# Patient Record
Sex: Female | Born: 1995 | Race: White | Hispanic: No | Marital: Single | Smoking: Never smoker
Health system: Southern US, Community
[De-identification: ages and names within clinical notes are randomized; demographics above are authoritative.]

---
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
## 2017-05-03 ENCOUNTER — Encounter: Payer: Self-pay | Admitting: Obstetrics & Gynecology

## 2017-05-03 ENCOUNTER — Ambulatory Visit (INDEPENDENT_AMBULATORY_CARE_PROVIDER_SITE_OTHER): Payer: BC Managed Care – PPO | Admitting: Obstetrics & Gynecology

## 2017-05-03 VITALS — BP 102/68 | Ht 67.0 in | Wt 106.0 lb

## 2017-05-03 DIAGNOSIS — N946 Dysmenorrhea, unspecified: Secondary | ICD-10-CM | POA: Diagnosis not present

## 2017-05-03 MED ORDER — NORETHIN ACE-ETH ESTRAD-FE 1-20 MG-MCG(24) PO TABS: 1.0000 | ORAL_TABLET | Freq: Every day | ORAL | 4 refills | Status: DC

## 2017-05-03 NOTE — Progress Notes (Signed)
    Lynn RichSavanah L Baker 01/31/1996 [id number]        21 y.o.  G0 single.  No coitarche.  [employer] [place] [place]  RP:  Dysmenorrhea worsening x 2 yrs  HPI:  Menses reg every month x 5 days.  Severe pelvic cramping and pain x 2-3 first days of period.  Pain is flooring her.  Occasional N/V.  Advil helpful, but not enough.  Flow normal.  No Pain outside periods.  No PMS.  No coitarche.  Fit, plays water-polo.  Past medical history,surgical history, problem list, medications, allergies, family history and social history were all reviewed and documented in the EPIC chart.  Directed ROS with pertinent positives and negatives documented in the history of present illness/assessment and plan.  Exam:  Vitals:   05/03/17 1524  BP: 102/68  Weight: 106 lb (48.1 kg)  Height: 5\' 7"  (1.702 m)   General appearance:  Normal  Heart:  RCR, no murmur  Lungs clear  Abdo:  Soft, NT, no mass.  Assessment/Plan:  21 y.o. G0  1. Severe dysmenorrhea Dysmenorrhea discussed, information given.  Reassured that pathology very unlikely.  If early Sx of Endometriosis, recommend attempting to control hormonally.  Different treatment options reviewed, including DepoProvera, BCPs, Nuvaring.  Nexplanon and Progesterone IUD briefly discussed but not recommended.  Decision to start on Lomedia Fe 1/20 (24) continuous use.  Usage/risks/benefits reviewed in details.  Prescription sent.  Will call back to be evaluated with Pelvic US if no improvement after about 3 packs.  Counseling on above issue >50% x 30 minutes.  Genia DelMarie-Lyne Conal Shetley MD, 3:40 PM 05/03/2017

## 2017-05-03 NOTE — Patient Instructions (Signed)
1. Severe dysmenorrhea Dysmenorrhea discussed, information given.  Reassured that pathology very unlikely.  If early Sx of Endometriosis, recommend attempting to control hormonally.  Different treatment options reviewed, including DepoProvera, BCPs, Nuvaring.  Nexplanon and Progesterone IUD briefly discussed but not recommended.  Decision to start on Lomedia Fe 1/20 (24) continuous use.  Usage/risks/benefits reviewed in details.  Prescription sent.  Will call back to be evaluated with Pelvic US if no improvement after about 3 packs.  Lynn Baker, it was a pleasure to meet you today!  Please call me if you have any problem until your next Annual/Gyn visit.  Dysmenorrhea Menstrual cramps (dysmenorrhea) are caused by the muscles of the uterus tightening (contracting) during a menstrual period. For some women, this discomfort is merely bothersome. For others, dysmenorrhea can be severe enough to interfere with everyday activities for a few days each month. Primary dysmenorrhea is menstrual cramps that last a couple of days when you start having menstrual periods or soon after. This often begins after a teenager starts having her period. As a woman gets older or has a baby, the cramps will usually lessen or disappear. Secondary dysmenorrhea begins later in life, lasts longer, and the pain may be stronger than primary dysmenorrhea. The pain may start before the period and last a few days after the period. What are the causes? Dysmenorrhea is usually caused by an underlying problem, such as:  The tissue lining the uterus grows outside of the uterus in other areas of the body (endometriosis).  The endometrial tissue, which normally lines the uterus, is found in or grows into the muscular walls of the uterus (adenomyosis).  The pelvic blood vessels are engorged with blood just before the menstrual period (pelvic congestive syndrome).  Overgrowth of cells (polyps) in the lining of the uterus or cervix.  Falling  down of the uterus (prolapse) because of loose or stretched ligaments.  Depression.  Bladder problems, infection, or inflammation.  Problems with the intestine, a tumor, or irritable bowel syndrome.  Cancer of the female organs or bladder.  A severely tipped uterus.  A very tight opening or closed cervix.  Noncancerous tumors of the uterus (fibroids).  Pelvic inflammatory disease (PID).  Pelvic scarring (adhesions) from a previous surgery.  Ovarian cyst.  An intrauterine device (IUD) used for birth control.  What increases the risk? You may be at greater risk of dysmenorrhea if:  You are younger than age 330.  You started puberty early.  You have irregular or heavy bleeding.  You have never given birth.  You have a family history of this problem.  You are a smoker.  What are the signs or symptoms?  Cramping or throbbing pain in your lower abdomen.  Headaches.  Lower back pain.  Nausea or vomiting.  Diarrhea.  Sweating or dizziness.  Loose stools. How is this diagnosed? A diagnosis is based on your history, symptoms, physical exam, diagnostic tests, or procedures. Diagnostic tests or procedures may include:  Blood tests.  Ultrasonography.  An examination of the lining of the uterus (dilation and curettage, D&C).  An examination inside your abdomen or pelvis with a scope (laparoscopy).  X-rays.  CT scan.  MRI.  An examination inside the bladder with a scope (cystoscopy).  An examination inside the intestine or stomach with a scope (colonoscopy, gastroscopy).  How is this treated? Treatment depends on the cause of the dysmenorrhea. Treatment may include:  Pain medicine prescribed by your health care provider.  Birth control pills or an IUD  with progesterone hormone in it.  Hormone replacement therapy.  Nonsteroidal anti-inflammatory drugs (NSAIDs). These may help stop the production of prostaglandins.  Surgery to remove adhesions,  endometriosis, ovarian cyst, or fibroids.  Removal of the uterus (hysterectomy).  Progesterone shots to stop the menstrual period.  Cutting the nerves on the sacrum that go to the female organs (presacral neurectomy).  Electric current to the sacral nerves (sacral nerve stimulation).  Antidepressant medicine.  Psychiatric therapy, counseling, or group therapy.  Exercise and physical therapy.  Meditation and yoga therapy.  Acupuncture.  Follow these instructions at home:  Only take over-the-counter or prescription medicines as directed by your health care provider.  Place a heating pad or hot water bottle on your lower back or abdomen. Do not sleep with the heating pad.  Use aerobic exercises, walking, swimming, biking, and other exercises to help lessen the cramping.  Massage to the lower back or abdomen may help.  Stop smoking.  Avoid alcohol and caffeine. Contact a health care provider if:  Your pain does not get better with medicine.  You have pain with sexual intercourse.  Your pain increases and is not controlled with medicines.  You have abnormal vaginal bleeding with your period.  You develop nausea or vomiting with your period that is not controlled with medicine. Get help right away if: You pass out. This information is not intended to replace advice given to you by your health care provider. Make sure you discuss any questions you have with your health care provider. Document Released: 09/07/2005 Document Revised: 02/13/2016 Document Reviewed: 02/23/2013 Elsevier Interactive Patient Education  2017 Elsevier Inc.   Oral Contraception Use Oral contraceptive pills (OCPs) are medicines taken to prevent pregnancy. OCPs work by preventing the ovaries from releasing eggs. The hormones in OCPs also cause the cervical mucus to thicken, preventing the sperm from entering the uterus. The hormones also cause the uterine lining to become thin, not allowing a  fertilized egg to attach to the inside of the uterus. OCPs are highly effective when taken exactly as prescribed. However, OCPs do not prevent sexually transmitted diseases (STDs). Safe sex practices, such as using condoms along with an OCP, can help prevent STDs. Before taking OCPs, you may have a physical exam and Pap test. Your health care provider may also order blood tests if necessary. Your health care provider will make sure you are a good candidate for oral contraception. Discuss with your health care provider the possible side effects of the OCP you may be prescribed. When starting an OCP, it can take 2 to 3 months for the body to adjust to the changes in hormone levels in your body. How to take oral contraceptive pills Your health care provider may advise you on how to start taking the first cycle of OCPs. Otherwise, you can:  Start on day 1 of your menstrual period. You will not need any backup contraceptive protection with this start time.  Start on the first Sunday after your menstrual period or the day you get your prescription. In these cases, you will need to use backup contraceptive protection for the first week.  Start the pill at any time of your cycle. If you take the pill within 5 days of the start of your period, you are protected against pregnancy right away. In this case, you will not need a backup form of birth control. If you start at any other time of your menstrual cycle, you will need to use another form of  birth control for 7 days. If your OCP is the type called a minipill, it will protect you from pregnancy after taking it for 2 days (48 hours).  After you have started taking OCPs:  If you forget to take 1 pill, take it as soon as you remember. Take the next pill at the regular time.  If you miss 2 or more pills, call your health care provider because different pills have different instructions for missed doses. Use backup birth control until your next menstrual period  starts.  If you use a 28-day pack that contains inactive pills and you miss 1 of the last 7 pills (pills with no hormones), it will not matter. Throw away the rest of the non-hormone pills and start a new pill pack.  No matter which day you start the OCP, you will always start a new pack on that same day of the week. Have an extra pack of OCPs and a backup contraceptive method available in case you miss some pills or lose your OCP pack. Follow these instructions at home:  Do not smoke.  Always use a condom to protect against STDs. OCPs do not protect against STDs.  Use a calendar to mark your menstrual period days.  Read the information and directions that came with your OCP. Talk to your health care provider if you have questions. Contact a health care provider if:  You develop nausea and vomiting.  You have abnormal vaginal discharge or bleeding.  You develop a rash.  You miss your menstrual period.  You are losing your hair.  You need treatment for mood swings or depression.  You get dizzy when taking the OCP.  You develop acne from taking the OCP.  You become pregnant. Get help right away if:  You develop chest pain.  You develop shortness of breath.  You have an uncontrolled or severe headache.  You develop numbness or slurred speech.  You develop visual problems.  You develop pain, redness, and swelling in the legs. This information is not intended to replace advice given to you by your health care provider. Make sure you discuss any questions you have with your health care provider. Document Released: 08/27/2011 Document Revised: 02/13/2016 Document Reviewed: 02/26/2013 Elsevier Interactive Patient Education  2017 [employer].

## 2018-05-23 ENCOUNTER — Other Ambulatory Visit: Payer: Self-pay | Admitting: Obstetrics & Gynecology

## 2018-05-24 NOTE — Telephone Encounter (Signed)
annual exam on 07/22/18

## 2018-07-22 ENCOUNTER — Ambulatory Visit (INDEPENDENT_AMBULATORY_CARE_PROVIDER_SITE_OTHER): Payer: BC Managed Care – PPO | Admitting: Obstetrics & Gynecology

## 2018-07-22 ENCOUNTER — Encounter: Payer: Self-pay | Admitting: Obstetrics & Gynecology

## 2018-07-22 VITALS — BP 126/78 | Ht 67.0 in | Wt 115.0 lb

## 2018-07-22 DIAGNOSIS — N944 Primary dysmenorrhea: Secondary | ICD-10-CM | POA: Diagnosis not present

## 2018-07-22 DIAGNOSIS — Z01419 Encounter for gynecological examination (general) (routine) without abnormal findings: Secondary | ICD-10-CM | POA: Diagnosis not present

## 2018-07-22 MED ORDER — NORETHIN ACE-ETH ESTRAD-FE 1-20 MG-MCG(24) PO TABS: ORAL_TABLET | ORAL | 4 refills | Status: DC

## 2018-07-22 NOTE — Patient Instructions (Signed)
1. Well female exam with routine gynecological exam Normal general exam.  Pelvic exam deferred because patient is still a virgin and has no gynecologic complaints.  Decision to proceed with first Pap test next year.  Body mass index stable at 18.01.  Patient informed that she is at the low and of normal and it is important for her to maintain her weight.  Fit and healthy nutrition.  2. Primary dysmenorrhea Primary severe dysmenorrhea well-controlled on continuous birth control pills with Lomedia FE 1/20 24.  No contraindication to continue.  Same birth control pill represcribed.  Other orders - Norethindrone Acetate-Ethinyl Estrad-FE (BLISOVI 24 FE) 1-20 MG-MCG(24) tablet; TAKE 1 TABLET BY MOUTH DAILY. CONTINUOUS USE FOR SEVERE DYSMENORRHEA  Saliyah, it was a pleasure seeing you today!

## 2018-07-22 NOTE — Progress Notes (Addendum)
Lynn Baker 1996/09/03 [id number]   History:    22 y.o. G0 single.  Graduating from [employer] in [place] December 2019.  RP:  Established patient presenting for annual gyn exam   HPI: Virgin.  On Lomedia Fe 1/20 24 continuous use for severe Dysmenorrhea.  Doing very well on it with no breakthrough bleeding and no pelvic pain.  Urine and bowel movements normal.  Breasts normal.  Body mass index 18.01.  Stable weight.  Past medical history,surgical history, family history and social history were all reviewed and documented in the EPIC chart.  Gynecologic History Patient's last menstrual period was 05/05/2017. Contraception: OCP (estrogen/progesterone) Last Pap: Never Last mammogram: Never Bone Density: Never Colonoscopy: Never  Obstetric History OB History  Gravida Para Term Preterm AB Living  0 0 0 0 0 0  SAB TAB Ectopic Multiple Live Births  0 0 0 0 0     ROS: A ROS was performed and pertinent positives and negatives are included in the history.  GENERAL: No fevers or chills. HEENT: No change in vision, no earache, sore throat or sinus congestion. NECK: No pain or stiffness. CARDIOVASCULAR: No chest pain or pressure. No palpitations. PULMONARY: No shortness of breath, cough or wheeze. GASTROINTESTINAL: No abdominal pain, nausea, vomiting or diarrhea, melena or bright red blood per rectum. GENITOURINARY: No urinary frequency, urgency, hesitancy or dysuria. MUSCULOSKELETAL: No joint or muscle pain, no back pain, no recent trauma. DERMATOLOGIC: No rash, no itching, no lesions. ENDOCRINE: No polyuria, polydipsia, no heat or cold intolerance. No recent change in weight. HEMATOLOGICAL: No anemia or easy bruising or bleeding. NEUROLOGIC: No headache, seizures, numbness, tingling or weakness. PSYCHIATRIC: No depression, no loss of interest in normal activity or change in sleep pattern.     Exam:   BP 126/78 (BP Location: Right Arm, Patient Position: Sitting, Cuff Size:  Normal)   Ht 5\' 7"  (1.702 m)   Wt 115 lb (52.2 kg)   LMP 05/05/2017 Comment: take bc cont  BMI 18.01 kg/m   Body mass index is 18.01 kg/m.  General appearance : Well developed well nourished female. No acute distress HEENT: Eyes: no retinal hemorrhage or exudates,  Neck supple, trachea midline, no carotid bruits, no thyroidmegaly Lungs: Clear to auscultation, no rhonchi or wheezes, or rib retractions  Heart: Regular rate and rhythm, no murmurs or gallops Breast:Examined in sitting and supine position were symmetrical in appearance, no palpable masses or tenderness,  no skin retraction, no nipple inversion, no nipple discharge, no skin discoloration, no axillary or supraclavicular lymphadenopathy Abdomen: no palpable masses or tenderness, no rebound or guarding Extremities: no edema or skin discoloration or tenderness  Pelvic: Deferred, virgin   Assessment/Plan:  22 y.o. female for annual exam   1. Well female exam with routine gynecological exam Normal general exam.  Pelvic exam deferred because patient is still a virgin and has no gynecologic complaints.  Decision to proceed with first Pap test next year.  Body mass index stable at 18.01.  Patient informed that she is at the low and of normal and it is important for her to maintain her weight.  Fit and healthy nutrition.  2. Primary dysmenorrhea Primary severe dysmenorrhea well-controlled on continuous birth control pills with Lomedia FE 1/20 24.  No contraindication to continue.  Same birth control pill represcribed.  Other orders - Norethindrone Acetate-Ethinyl Estrad-FE (BLISOVI 24 FE) 1-20 MG-MCG(24) tablet; TAKE 1 TABLET BY MOUTH DAILY. CONTINUOUS USE FOR SEVERE DYSMENORRHEA  Genia Del  MD, 2:03 PM 07/22/2018

## 2018-12-09 ENCOUNTER — Other Ambulatory Visit: Payer: Self-pay

## 2018-12-09 ENCOUNTER — Encounter: Payer: Self-pay | Admitting: Obstetrics & Gynecology

## 2018-12-09 ENCOUNTER — Ambulatory Visit: Payer: BC Managed Care – PPO | Admitting: Obstetrics & Gynecology

## 2018-12-09 VITALS — BP 112/70

## 2018-12-09 DIAGNOSIS — B3731 Acute candidiasis of vulva and vagina: Secondary | ICD-10-CM

## 2018-12-09 DIAGNOSIS — R300 Dysuria: Secondary | ICD-10-CM | POA: Diagnosis not present

## 2018-12-09 DIAGNOSIS — B373 Candidiasis of vulva and vagina: Secondary | ICD-10-CM

## 2018-12-09 DIAGNOSIS — N898 Other specified noninflammatory disorders of vagina: Secondary | ICD-10-CM | POA: Diagnosis not present

## 2018-12-09 LAB — WET PREP FOR TRICH, YEAST, CLUE

## 2018-12-09 MED ORDER — FLUCONAZOLE 150 MG PO TABS: 150.0000 mg | ORAL_TABLET | Freq: Every day | ORAL | 1 refills | Status: AC

## 2018-12-09 NOTE — Progress Notes (Signed)
    Lynn Baker 10/09/1995 [id number]        23 y.o.  G0 Single  RP: Pain with urination and pelvic discomfort/low back pain x a few days  HPI: Complains of discomfort in the pelvis and with urination associated with low back pain for a few days.  No blood in urine.  No fever.  Virgin.  Normal regular periods every month.  No breakthrough bleeding.   OB History  Gravida Para Term Preterm AB Living  0 0 0 0 0 0  SAB TAB Ectopic Multiple Live Births  0 0 0 0 0    Past medical history,surgical history, problem list, medications, allergies, family history and social history were all reviewed and documented in the EPIC chart.   Directed ROS with pertinent positives and negatives documented in the history of present illness/assessment and plan.  Exam:  Vitals:   12/09/18 0955  BP: 112/70   General appearance:  Normal  Abdomen: Normal  CVAT negative bilaterally  Gynecologic exam: Vulva with increased yeasty discharge.  Wet prep done.  U/A completely negative Wet prep:  Yeasts present   Assessment/Plan:  23 y.o. G0  1. Dysuria Urine analysis completely negative.  Patient reassured. - Urinalysis,Complete w/RFL Culture  2. Vaginal discharge Yeast vaginitis confirmed with wet prep.  3. Yeast vaginitis Yeast vaginitis confirmed by wet prep.  Diagnosis and treatment counseling done.  Decision to treat with fluconazole.  Usage reviewed and prescription sent to pharmacy. Other orders - fluconazole (DIFLUCAN) 150 MG tablet; Take 1 tablet (150 mg total) by mouth daily for 3 days.  Counseling on above issues and coordination of care more than 50% for 15 minutes.  Genia Del MD, 10:19 AM 12/09/2018

## 2018-12-10 LAB — URINALYSIS, COMPLETE W/RFL CULTURE
BILIRUBIN URINE: NEGATIVE
Bacteria, UA: NONE SEEN /HPF
GLUCOSE, UA: NEGATIVE
HGB URINE DIPSTICK: NEGATIVE
HYALINE CAST: NONE SEEN /LPF
KETONES UR: NEGATIVE
Leukocyte Esterase: NEGATIVE
Nitrites, Initial: NEGATIVE
PH: 5.5 (ref 5.0–8.0)
Protein, ur: NEGATIVE
RBC / HPF: NONE SEEN /HPF (ref 0–2)
Specific Gravity, Urine: 1.02 (ref 1.001–1.03)
WBC UA: NONE SEEN /HPF (ref 0–5)

## 2018-12-10 LAB — NO CULTURE INDICATED

## 2018-12-11 ENCOUNTER — Encounter: Payer: Self-pay | Admitting: Obstetrics & Gynecology

## 2018-12-11 NOTE — Patient Instructions (Signed)
1. Dysuria Urine analysis completely negative.  Patient reassured. - Urinalysis,Complete w/RFL Culture  2. Vaginal discharge Yeast vaginitis confirmed with wet prep.  3. Yeast vaginitis Yeast vaginitis confirmed by wet prep.  Diagnosis and treatment counseling done.  Decision to treat with fluconazole.  Usage reviewed and prescription sent to pharmacy. Other orders - fluconazole (DIFLUCAN) 150 MG tablet; Take 1 tablet (150 mg total) by mouth daily for 3 days.  Tanyiah, it was a pleasure seeing you today!

## 2019-03-20 ENCOUNTER — Other Ambulatory Visit: Payer: Self-pay

## 2019-03-21 ENCOUNTER — Encounter: Payer: Self-pay | Admitting: Obstetrics & Gynecology

## 2019-03-21 ENCOUNTER — Ambulatory Visit: Payer: BC Managed Care – PPO | Admitting: Obstetrics & Gynecology

## 2019-03-21 VITALS — BP 102/68

## 2019-03-21 DIAGNOSIS — N644 Mastodynia: Secondary | ICD-10-CM | POA: Diagnosis not present

## 2019-03-21 NOTE — Progress Notes (Signed)
    Lynn Baker 1996/06/20 [id number]        22 y.o.  G0 Single.  Graduated 08/[employer]Looking for a job.  RP: Left breast pain x 2 weeks  HPI: Left breast pain/tenderness at external quadrant x 2 weeks.  The pain/tenderness is improving x a few days.  No lump felt.  No redness.  No nipple discharge.  No fever.  Well on continuous BCPs.  No BTB.  No pelvic pain.  No first-degree relative with breast cancer.   OB History  Gravida Para Term Preterm AB Living  0 0 0 0 0 0  SAB TAB Ectopic Multiple Live Births  0 0 0 0 0    Past medical history,surgical history, problem list, medications, allergies, family history and social history were all reviewed and documented in the EPIC chart.   Directed ROS with pertinent positives and negatives documented in the history of present illness/assessment and plan.  Exam:  Vitals:   03/21/19 1454  BP: 102/68   General appearance:  Normal  Breast exam:  Rt breast normal.  Rt axilla normal.                           Lt breast mildly tender at upper external quadrant.  No nodule/mass felt.  No erythema.  No nipple discharge.  Lt axilla normal.   Assessment/Plan:  23 y.o. G0  1. Pain of left breast Left breast pain and tenderness at the external quadrant for 2 weeks, currently improving.  Breast exam mildly tender at the left upper external quadrant but otherwise negative.  Decision to proceed with a left breast ultrasound for further investigation.  Patient reassured.  Recommended not to examine her breasts until the left breast ultrasound.  Will continue on continuous birth control pills.  Counseling on above issues and coordination of care more than 50% for 15 minutes.  Princess Bruins MD, 3:10 PM 03/21/2019

## 2019-03-21 NOTE — Patient Instructions (Signed)
1. Pain of left breast Left breast pain and tenderness at the external quadrant for 2 weeks, currently improving.  Breast exam mildly tender at the left upper external quadrant but otherwise negative.  Decision to proceed with a left breast ultrasound for further investigation.  Patient reassured.  Recommended not to examine her breasts until the left breast ultrasound.  Will continue on continuous birth control pills.  Lynn Baker, it was a pleasure seeing you today!

## 2019-03-22 ENCOUNTER — Telehealth: Payer: Self-pay | Admitting: *Deleted

## 2019-03-22 DIAGNOSIS — N644 Mastodynia: Secondary | ICD-10-CM

## 2019-03-22 NOTE — Telephone Encounter (Signed)
Patient scheduled at breast center on 04/03/19 @ 12:20, patient informed to wear mask, come alone due to covid.

## 2019-03-22 NOTE — Telephone Encounter (Signed)
-----   Message from Princess Bruins, MD sent at 03/21/2019  3:31 PM EDT ----- Regarding: Left breast US Left breast pain/tenderness x 2 weeks.  Mildly tender at the upper external quadrant of the Lt breast.  Schedule a Lt breast US.  No 1st degree relative with h/o Breast Ca.

## 2019-04-03 ENCOUNTER — Ambulatory Visit
Admission: RE | Admit: 2019-04-03 | Discharge: 2019-04-03 | Disposition: A | Discharge status: Home / Self Care | Payer: BC Managed Care – PPO | Source: Ambulatory Visit | Attending: Obstetrics & Gynecology | Admitting: Obstetrics & Gynecology

## 2019-04-03 DIAGNOSIS — N644 Mastodynia: Secondary | ICD-10-CM

## 2019-09-05 ENCOUNTER — Other Ambulatory Visit: Payer: Self-pay | Admitting: Obstetrics & Gynecology

## 2019-09-05 NOTE — Telephone Encounter (Signed)
Annual exam on 11/13/19

## 2019-11-06 IMAGING — US US BREAST*L* LIMITED INC AXILLA
1 series · 8 of 8 positions shown · non-contrast
Comparison: None.

CLINICAL DATA: 23-year-old complaining of pain in the upper-outer
quadrant of the left breast.

EXAM:
ULTRASOUND OF THE LEFT BREAST

[Series 1: us breast*left* limited inc axilla · 0.06mm/px · 8 of 8 slices shown]
[im 1/8]
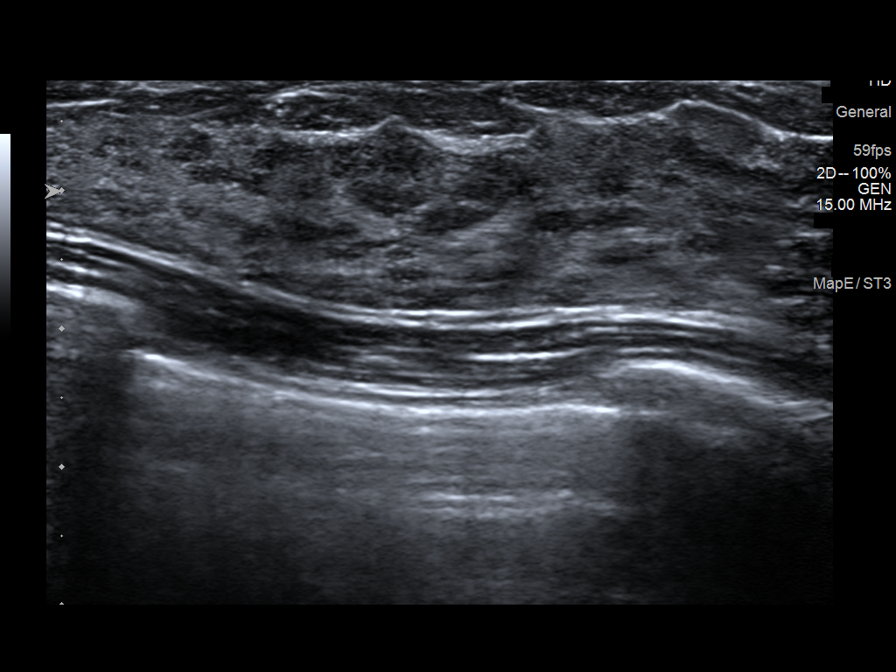
[im 2/8]
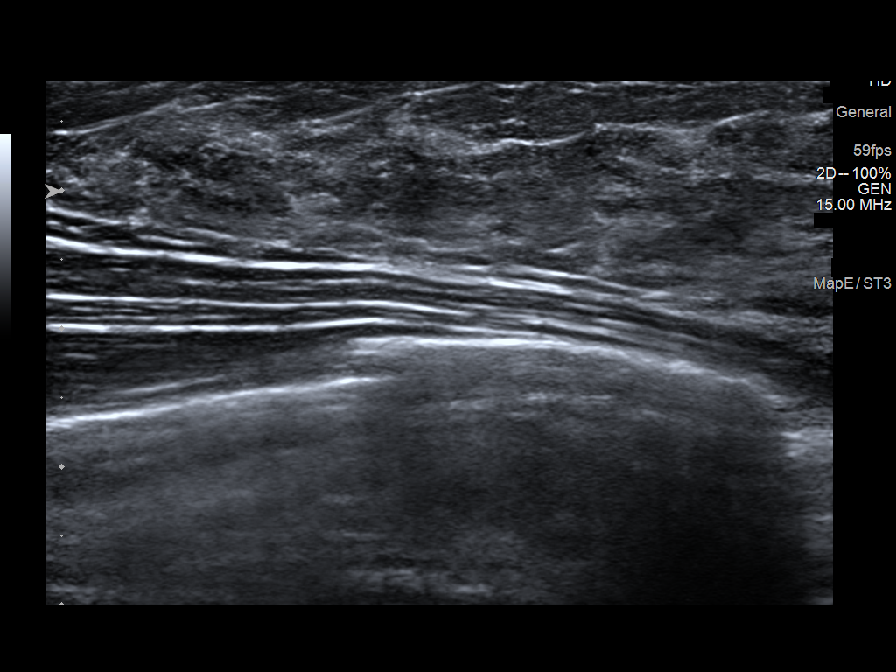
[im 3/8]
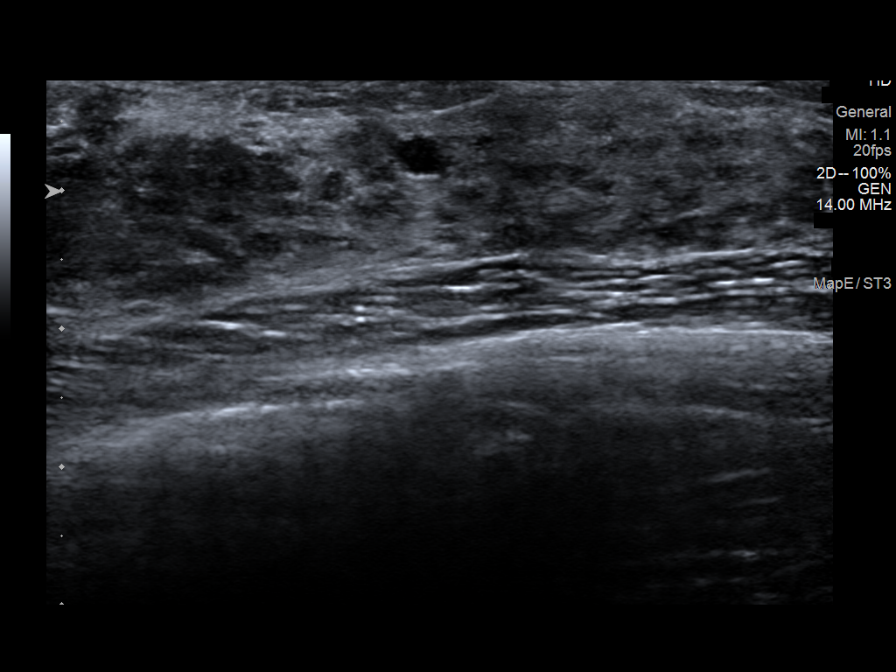
[im 4/8]
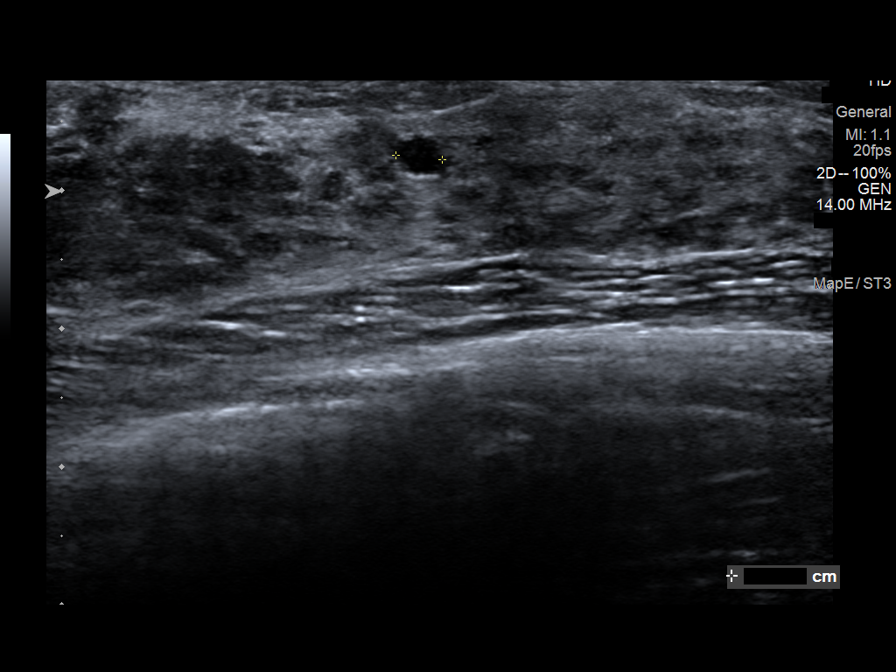
[im 5/8]
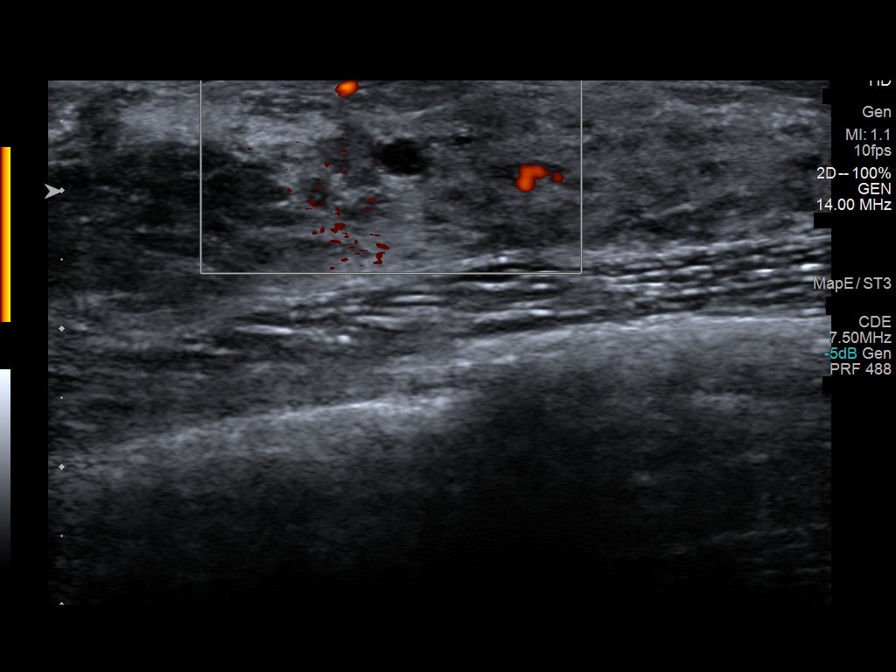
[im 6/8]
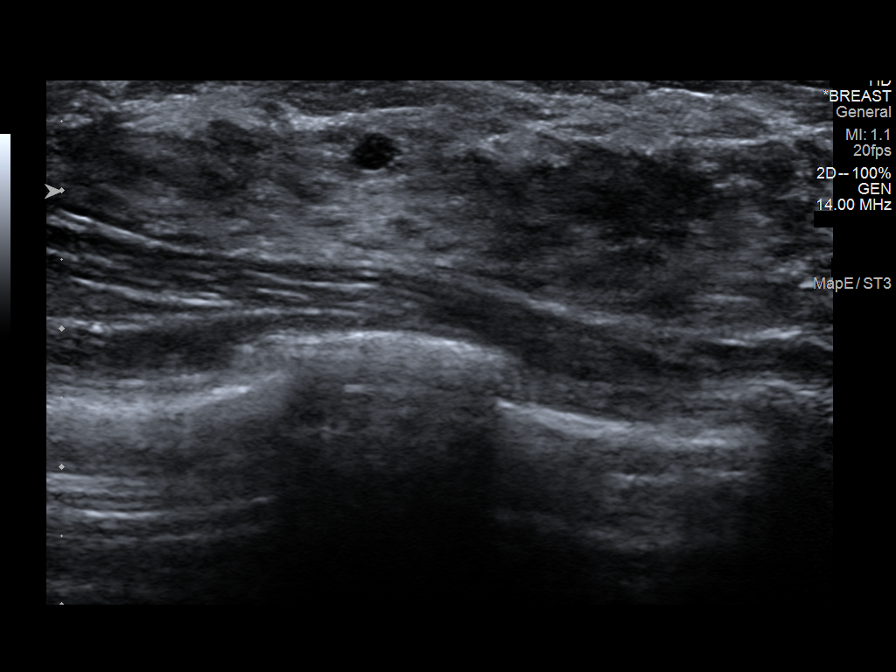
[im 7/8]
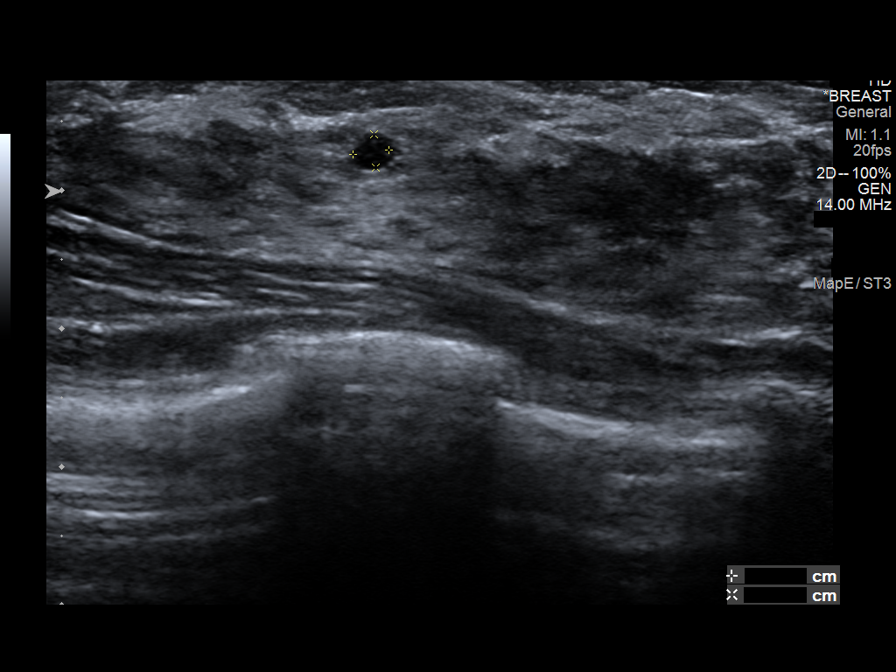
[im 8/8]
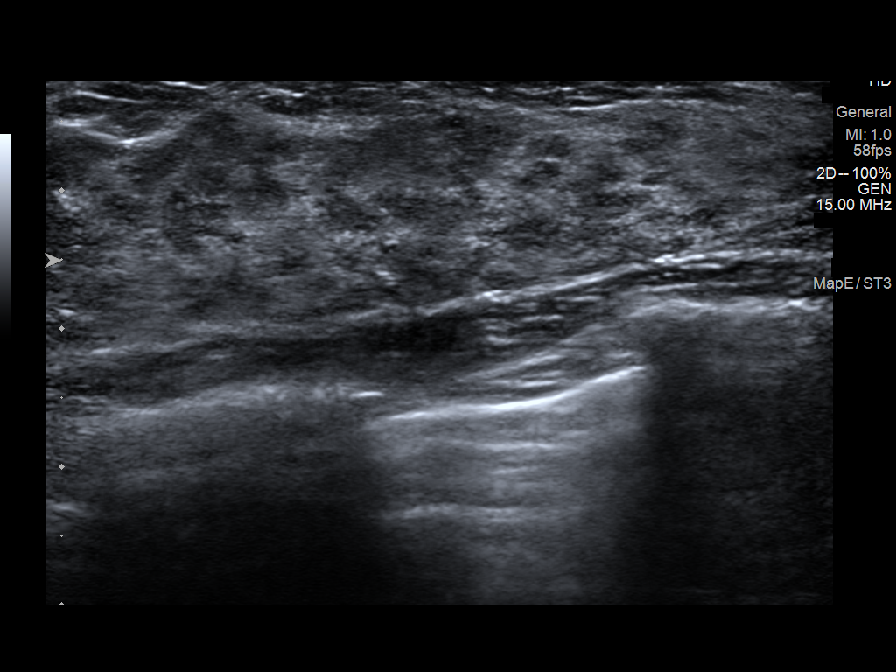

[8 of 8 positions shown; findings below may reference images not displayed]

FINDINGS: On physical exam, I do not palpate a mass in the upper-outer
quadrant of the left breast.

Targeted ultrasound is performed, showing dense fibroglandular
tissue. An incidental anechoic cyst is seen at 2 o'clock 4 cm from
the nipple measuring 3 mm. No solid mass, abnormal shadowing or
distortion visualized.
IMPRESSION: No evidence of malignancy in the left breast.

RECOMMENDATION:
If the clinical exam remains benign/stable screening mammography can
be deferred until the age of 41.

I have discussed the findings and recommendations with the patient.
Results were also provided in writing at the conclusion of the
visit. If applicable, a reminder letter will be sent to the patient
regarding the next appointment.

BI-RADS CATEGORY  2: Benign.

## 2019-11-10 ENCOUNTER — Other Ambulatory Visit: Payer: Self-pay

## 2019-11-13 ENCOUNTER — Other Ambulatory Visit: Payer: Self-pay

## 2019-11-13 ENCOUNTER — Ambulatory Visit: Payer: BC Managed Care – PPO | Admitting: Obstetrics & Gynecology

## 2019-11-13 ENCOUNTER — Encounter: Payer: Self-pay | Admitting: Obstetrics & Gynecology

## 2019-11-13 VITALS — BP 118/80 | Ht 66.5 in | Wt 107.0 lb

## 2019-11-13 DIAGNOSIS — R636 Underweight: Secondary | ICD-10-CM | POA: Diagnosis not present

## 2019-11-13 DIAGNOSIS — N944 Primary dysmenorrhea: Secondary | ICD-10-CM | POA: Diagnosis not present

## 2019-11-13 DIAGNOSIS — Z01419 Encounter for gynecological examination (general) (routine) without abnormal findings: Secondary | ICD-10-CM | POA: Diagnosis not present

## 2019-11-13 MED ORDER — BLISOVI 24 FE 1-20 MG-MCG(24) PO TABS: ORAL_TABLET | ORAL | 4 refills | Status: DC

## 2019-11-13 NOTE — Patient Instructions (Signed)
1. Well female exam with routine gynecological exam Normal general exam.  Breast exam normal.  Deferred gynecologic exam and Pap test, given that patient is a virgin.    2. Primary dysmenorrhea Controlled dysmenorrhea on continuous birth control pills with Blisovi 24 FE 1/20.  Well-tolerated and no breakthrough bleeding.  No contraindication to continue on birth control pills.  Prescription sent to pharmacy.  3. Mildly underweight adult Recommend increasing calories, ice cream/milk/yogurt-based smoothies recommended.  Other orders - Norethindrone Acetate-Ethinyl Estrad-FE (BLISOVI 24 FE) 1-20 MG-MCG(24) tablet; TAKE 1 TABLET BY MOUTH DAILY. CONTINUOUS USE FOR SEVERE DYSMENORRHEA  Lynn Baker, it was a pleasure seeing you today!

## 2019-11-13 NOTE — Progress Notes (Signed)
    Lynn Baker 12-Aug-1996 [id number]   History:    24 y.o. G0 single.  Graduated from [employer].  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  RP:  Established patient presenting for annual gyn exam   HPI: Virgin.  On Lomedia Fe 1/20 24 continuous use for severe Dysmenorrhea.  Doing very well on it with no breakthrough bleeding and no pelvic pain.  Urine and bowel movements normal.  Breasts normal.  Body mass index decreased to 17.01. No eating disorder.  Trying to increase her calories.  Not exercising on a regular basis.   Past medical history,surgical history, family history and social history were all reviewed and documented in the EPIC chart.  Gynecologic History Patient's last menstrual period was 11/10/2018 (within days).  Obstetric History OB History  Gravida Para Term Preterm AB Living  0 0 0 0 0 0  SAB TAB Ectopic Multiple Live Births  0 0 0 0 0     ROS: A ROS was performed and pertinent positives and negatives are included in the history.  GENERAL: No fevers or chills. HEENT: No change in vision, no earache, sore throat or sinus congestion. NECK: No pain or stiffness. CARDIOVASCULAR: No chest pain or pressure. No palpitations. PULMONARY: No shortness of breath, cough or wheeze. GASTROINTESTINAL: No abdominal pain, nausea, vomiting or diarrhea, melena or bright red blood per rectum. GENITOURINARY: No urinary frequency, urgency, hesitancy or dysuria. MUSCULOSKELETAL: No joint or muscle pain, no back pain, no recent trauma. DERMATOLOGIC: No rash, no itching, no lesions. ENDOCRINE: No polyuria, polydipsia, no heat or cold intolerance. No recent change in weight. HEMATOLOGICAL: No anemia or easy bruising or bleeding. NEUROLOGIC: No headache, seizures, numbness, tingling or weakness. PSYCHIATRIC: No depression, no loss of interest in normal activity or change in sleep pattern.     Exam:   BP 118/80 (BP Location: Right Arm, Patient Position: Sitting, Cuff Size: Normal)   Ht 5' 6.5" (1.689 m)    Wt 107 lb (48.5 kg)   LMP 11/10/2018 (Within Days)   BMI 17.01 kg/m   Body mass index is 17.01 kg/m.  General appearance : Well developed well nourished female. No acute distress HEENT: Eyes: no retinal hemorrhage or exudates,  Neck supple, trachea midline, no carotid bruits, no thyroidmegaly Lungs: Clear to auscultation, no rhonchi or wheezes, or rib retractions  Heart: Regular rate and rhythm, no murmurs or gallops Breast:Examined in sitting and supine position were symmetrical in appearance, no palpable masses or tenderness,  no skin retraction, no nipple inversion, no nipple discharge, no skin discoloration, no axillary or supraclavicular lymphadenopathy Abdomen: no palpable masses or tenderness, no rebound or guarding Extremities: no edema or skin discoloration or tenderness  Pelvic: Virgin, deferred.   Assessment/Plan:  24 y.o. female for annual exam   1. Well female exam with routine gynecological exam Normal general exam.  Breast exam normal.  Deferred gynecologic exam and Pap test, given that patient is a virgin.    2. Primary dysmenorrhea Controlled dysmenorrhea on continuous birth control pills with Blisovi 24 FE 1/20.  Well-tolerated and no breakthrough bleeding.  No contraindication to continue on birth control pills.  Prescription sent to pharmacy.  3. Mildly underweight adult Recommend increasing calories, ice cream/milk/yogurt-based smoothies recommended.  Other orders - Norethindrone Acetate-Ethinyl Estrad-FE (BLISOVI 24 FE) 1-20 MG-MCG(24) tablet; TAKE 1 TABLET BY MOUTH DAILY. CONTINUOUS USE FOR SEVERE DYSMENORRHEA  Genia Del MD, 2:32 PM 11/13/2019

## 2020-11-13 ENCOUNTER — Encounter: Payer: Self-pay | Admitting: Obstetrics & Gynecology

## 2020-11-13 ENCOUNTER — Ambulatory Visit: Payer: BC Managed Care – PPO | Admitting: Obstetrics & Gynecology

## 2020-11-13 ENCOUNTER — Other Ambulatory Visit: Payer: Self-pay

## 2020-11-13 VITALS — BP 100/70 | HR 90 | Resp 18 | Ht 66.14 in | Wt 113.0 lb

## 2020-11-13 DIAGNOSIS — N944 Primary dysmenorrhea: Secondary | ICD-10-CM | POA: Diagnosis not present

## 2020-11-13 DIAGNOSIS — Z01419 Encounter for gynecological examination (general) (routine) without abnormal findings: Secondary | ICD-10-CM

## 2020-11-13 MED ORDER — BLISOVI 24 FE 1-20 MG-MCG(24) PO TABS: ORAL_TABLET | ORAL | 5 refills | Status: DC

## 2020-11-13 NOTE — Progress Notes (Signed)
    Lynn Baker Sep 17, 1996 [id number]   History:    25 y.o.  [place].Graduated from [employer], now at [employer] in [employer].  Has a dog.  SE:[id number] presenting for annual gyn exam   RSW:NIOEVO. Well on Lomedia Fe 1/20 24 continuous use for severe Dysmenorrhea.No breakthrough bleeding and no pelvic pain. Urine and bowel movements normal. Breasts normal. Body mass index decreased to 18.16. Walking her dog.  Past medical history,surgical history, family history and social history were all reviewed and documented in the EPIC chart.  Gynecologic History No LMP recorded. (Menstrual status: Oral contraceptives).  Obstetric History OB History  Gravida Para Term Preterm AB Living  0 0 0 0 0 0  SAB IAB Ectopic Multiple Live Births  0 0 0 0 0     ROS: A ROS was performed and pertinent positives and negatives are included in the history.  GENERAL: No fevers or chills. HEENT: No change in vision, no earache, sore throat or sinus congestion. NECK: No pain or stiffness. CARDIOVASCULAR: No chest pain or pressure. No palpitations. PULMONARY: No shortness of breath, cough or wheeze. GASTROINTESTINAL: No abdominal pain, nausea, vomiting or diarrhea, melena or bright red blood per rectum. GENITOURINARY: No urinary frequency, urgency, hesitancy or dysuria. MUSCULOSKELETAL: No joint or muscle pain, no back pain, no recent trauma. DERMATOLOGIC: No rash, no itching, no lesions. ENDOCRINE: No polyuria, polydipsia, no heat or cold intolerance. No recent change in weight. HEMATOLOGICAL: No anemia or easy bruising or bleeding. NEUROLOGIC: No headache, seizures, numbness, tingling or weakness. PSYCHIATRIC: No depression, no loss of interest in normal activity or change in sleep pattern.     Exam:   BP 100/70 (BP Location: Right Arm, Patient Position: Sitting)   Pulse 90   Resp 18   Ht 5' 6.14" (1.68 m)   Wt 113 lb (51.3 kg)   BMI 18.16 kg/m   Body mass index is 18.16  kg/m.  General appearance : Well developed well nourished female. No acute distress HEENT: Eyes: no retinal hemorrhage or exudates,  Neck supple, trachea midline, no carotid bruits, no thyroidmegaly Lungs: Clear to auscultation, no rhonchi or wheezes, or rib retractions  Heart: Regular rate and rhythm, no murmurs or gallops Breast:Examined in sitting and supine position were symmetrical in appearance, no palpable masses or tenderness,  no skin retraction, no nipple inversion, no nipple discharge, no skin discoloration, no axillary or supraclavicular lymphadenopathy Abdomen: no palpable masses or tenderness, no rebound or guarding Extremities: no edema or skin discoloration or tenderness  Pelvic: Virgin.  Deferred.   Assessment/Plan:  25 y.o. female for annual exam   1. Well female exam with routine gynecological exam Normal general exam including breast exam.  Patient is a virgin, gynecologic exam deferred.  Improved body mass index at 18.16.  Was underweight last year.  2. Primary dysmenorrhea Primary dysmenorrhea controlled on continuous low-dose birth control pills.  No contraindication to continue.  Blisovi 24 FE 1/20 represcribed.  Other orders - Norethindrone Acetate-Ethinyl Estrad-FE (BLISOVI 24 FE) 1-20 MG-MCG(24) tablet; TAKE 1 TABLET BY MOUTH DAILY. CONTINUOUS USE FOR SEVERE DYSMENORRHEA  Genia Del MD, 4:40 PM 11/13/2020

## 2021-12-02 ENCOUNTER — Other Ambulatory Visit: Payer: Self-pay

## 2021-12-02 ENCOUNTER — Ambulatory Visit (INDEPENDENT_AMBULATORY_CARE_PROVIDER_SITE_OTHER): Payer: BC Managed Care – PPO | Admitting: Obstetrics & Gynecology

## 2021-12-02 ENCOUNTER — Encounter: Payer: Self-pay | Admitting: Obstetrics & Gynecology

## 2021-12-02 VITALS — BP 110/64 | HR 74 | Resp 16 | Ht 66.75 in | Wt 109.0 lb

## 2021-12-02 DIAGNOSIS — Z01419 Encounter for gynecological examination (general) (routine) without abnormal findings: Secondary | ICD-10-CM

## 2021-12-02 DIAGNOSIS — N944 Primary dysmenorrhea: Secondary | ICD-10-CM

## 2021-12-02 MED ORDER — BLISOVI 24 FE 1-20 MG-MCG(24) PO TABS: ORAL_TABLET | ORAL | 5 refills | Status: DC

## 2021-12-02 NOTE — Progress Notes (Signed)
? ? ?Lynn Baker August 03, 1996 [id number] ? ? ?History:    26 y.o. G0 single.  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  Has a dog. ?  ?RP:  Established patient presenting for annual gyn exam  ?  ?HPI: Virgin.  No Pap so far. Well on Lomedia Fe 1/20 24 continuous use for severe Dysmenorrhea.  No breakthrough bleeding and no pelvic pain.  Urine and bowel movements normal.  Breasts normal. Left Breast US 03/2019 Benign. Body mass index at 17.2.  Mild increase in Calories recommended. Planning more weight lifting. Walking her dog. ? ? ?Past medical history,surgical history, family history and social history were all reviewed and documented in the EPIC chart. ? ?Gynecologic History ?No LMP recorded. (Menstrual status: Oral contraceptives). ? ?Obstetric History ?OB History  ?Gravida Para Term Preterm AB Living  ?0 0 0 0 0 0  ?SAB IAB Ectopic Multiple Live Births  ?0 0 0 0 0  ? ? ? ?ROS: A ROS was performed and pertinent positives and negatives are included in the history. ? GENERAL: No fevers or chills. HEENT: No change in vision, no earache, sore throat or sinus congestion. NECK: No pain or stiffness. CARDIOVASCULAR: No chest pain or pressure. No palpitations. PULMONARY: No shortness of breath, cough or wheeze. GASTROINTESTINAL: No abdominal pain, nausea, vomiting or diarrhea, melena or bright red blood per rectum. GENITOURINARY: No urinary frequency, urgency, hesitancy or dysuria. MUSCULOSKELETAL: No joint or muscle pain, no back pain, no recent trauma. DERMATOLOGIC: No rash, no itching, no lesions. ENDOCRINE: No polyuria, polydipsia, no heat or cold intolerance. No recent change in weight. HEMATOLOGICAL: No anemia or easy bruising or bleeding. NEUROLOGIC: No headache, seizures, numbness, tingling or weakness. PSYCHIATRIC: No depression, no loss of interest in normal activity or change in sleep pattern.  ?  ? ?Exam: ? ? ?BP 110/64   Pulse 74   Resp 16   Ht 5' 6.75" (1.695 m)   Wt 109 lb  (49.4 kg)   BMI 17.20 kg/m?  ? ?Body mass index is 17.2 kg/m?. ? ?General appearance : Well developed well nourished female. No acute distress ?HEENT: Eyes: no retinal hemorrhage or exudates,  Neck supple, trachea midline, no carotid bruits, no thyroidmegaly ?Lungs: Clear to auscultation, no rhonchi or wheezes, or rib retractions  ?Heart: Regular rate and rhythm, no murmurs or gallops ?Breast:Examined in sitting and supine position were symmetrical in appearance, no palpable masses or tenderness,  no skin retraction, no nipple inversion, no nipple discharge, no skin discoloration, no axillary or supraclavicular lymphadenopathy ?Abdomen: no palpable masses or tenderness, no rebound or guarding ?Extremities: no edema or skin discoloration or tenderness ? ?Pelvic:  Deferred, re Virgin ? ? ?Assessment/Plan:  26 y.o. female for annual exam  ? ?1. Well female exam with routine gynecological exam ?Virgin.  No Pap so far. Well on Lomedia Fe 1/20 24 continuous use for severe Dysmenorrhea.  No breakthrough bleeding and no pelvic pain.  Urine and bowel movements normal.  Breasts normal. Left Breast US 03/2019 Benign. Body mass index at 17.2.  Mild increase in Calories recommended. Planning more weight lifting. Walking her dog. ? ?2. Primary dysmenorrhea ?Well on Lomedia Fe 1/20 24 continuous use for severe Dysmenorrhea.  No breakthrough bleeding and no pelvic pain.  No CI to continuous Blisovi 24 Fe 1/20.  Prescription sent to pharmacy. ? ?Other orders ?- Norethindrone Acetate-Ethinyl Estrad-FE (BLISOVI 24 FE) 1-20 MG-MCG(24) tablet; TAKE 1 TABLET BY MOUTH DAILY. CONTINUOUS USE FOR  SEVERE DYSMENORRHEA  ? ?Princess Bruins MD, 11:21 AM 12/02/2021 ? ?  ?

## 2022-04-12 ENCOUNTER — Emergency Department (HOSPITAL_BASED_OUTPATIENT_CLINIC_OR_DEPARTMENT_OTHER)
Admission: EM | Admit: 2022-04-12 | Discharge: 2022-04-12 | Disposition: A | Discharge status: Home / Self Care | Payer: BC Managed Care – PPO | Attending: Emergency Medicine | Admitting: Emergency Medicine

## 2022-04-12 ENCOUNTER — Other Ambulatory Visit: Payer: Self-pay

## 2022-04-12 ENCOUNTER — Encounter (HOSPITAL_BASED_OUTPATIENT_CLINIC_OR_DEPARTMENT_OTHER): Payer: Self-pay | Admitting: Obstetrics and Gynecology

## 2022-04-12 DIAGNOSIS — Y92410 Unspecified street and highway as the place of occurrence of the external cause: Secondary | ICD-10-CM | POA: Diagnosis not present

## 2022-04-12 DIAGNOSIS — S3992XA Unspecified injury of lower back, initial encounter: Secondary | ICD-10-CM | POA: Diagnosis present

## 2022-04-12 DIAGNOSIS — S30810A Abrasion of lower back and pelvis, initial encounter: Secondary | ICD-10-CM | POA: Diagnosis not present

## 2022-04-12 DIAGNOSIS — S20412A Abrasion of left back wall of thorax, initial encounter: Secondary | ICD-10-CM

## 2022-04-12 MED ORDER — IBUPROFEN 800 MG PO TABS
800.0000 mg | ORAL_TABLET | Freq: Once | ORAL | Status: AC
  Administered 2022-04-12: 800 mg via ORAL
  Filled 2022-04-12: qty 1
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
## 2022-04-12 NOTE — Discharge Instructions (Signed)
Ibuprofen for discomfort  

## 2022-12-28 ENCOUNTER — Other Ambulatory Visit: Payer: Self-pay | Admitting: Obstetrics & Gynecology

## 2022-12-28 NOTE — Telephone Encounter (Signed)
Medication refill request: Norethidrone  Last AEX:  12/02/21  Next AEX: 01/24/22 Last MMG (if hormonal medication request): n/a Refill authorized: #[id number] with 0 Rf pended for today

## 2023-01-21 ENCOUNTER — Encounter: Payer: Self-pay | Admitting: Obstetrics & Gynecology

## 2023-01-23 ENCOUNTER — Other Ambulatory Visit: Payer: Self-pay | Admitting: Obstetrics & Gynecology

## 2023-01-25 ENCOUNTER — Ambulatory Visit (INDEPENDENT_AMBULATORY_CARE_PROVIDER_SITE_OTHER): Payer: BC Managed Care – PPO | Admitting: Obstetrics & Gynecology

## 2023-01-25 ENCOUNTER — Encounter: Payer: Self-pay | Admitting: Obstetrics & Gynecology

## 2023-01-25 VITALS — BP 110/70 | HR 92 | Ht 66.75 in | Wt 111.0 lb

## 2023-01-25 DIAGNOSIS — Z01419 Encounter for gynecological examination (general) (routine) without abnormal findings: Secondary | ICD-10-CM

## 2023-01-25 DIAGNOSIS — N944 Primary dysmenorrhea: Secondary | ICD-10-CM

## 2023-01-25 MED ORDER — HAILEY 24 FE 1-20 MG-MCG(24) PO TABS: ORAL_TABLET | ORAL | 5 refills | Status: DC

## 2023-01-25 NOTE — Telephone Encounter (Signed)
Med refill request: Hailey 24 Last AEX: 12/02/21 -ML Next AEX: 01/25/23 -ML Last MMG (if hormonal med) N/A    Patient is scheduled for AEX today, 01/25/23.   Rx refused. Will wait for AEX to provide RX.   Routing to provider for final review. Will close encounter.

## 2023-01-25 NOTE — Progress Notes (Signed)
Lynn Baker 16-Oct-1995 [id number]   History:    27 y.o.  G0 single.  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  Has a dog.   RP:  Established patient presenting for annual gyn exam    HPI: Virgin.  No Pap so far, would like to do one next year. Well on Lomedia Fe 1/20 24 continuous use for severe Dysmenorrhea.  No breakthrough bleeding and no pelvic pain.  Urine and bowel movements normal. Breasts normal. Left Breast US 03/2019 Benign. Body mass index at 17.52.  Mild increase in Calories recommended. Planning more weight lifting. Walking her dog.   Past medical history,surgical history, family history and social history were all reviewed and documented in the EPIC chart.  Gynecologic History No LMP recorded. (Menstrual status: Oral contraceptives).  Obstetric History OB History  Gravida Para Term Preterm AB Living  0 0 0 0 0 0  SAB IAB Ectopic Multiple Live Births  0 0 0 0 0     ROS: A ROS was performed and pertinent positives and negatives are included in the history. GENERAL: No fevers or chills. HEENT: No change in vision, no earache, sore throat or sinus congestion. NECK: No pain or stiffness. CARDIOVASCULAR: No chest pain or pressure. No palpitations. PULMONARY: No shortness of breath, cough or wheeze. GASTROINTESTINAL: No abdominal pain, nausea, vomiting or diarrhea, melena or bright red blood per rectum. GENITOURINARY: No urinary frequency, urgency, hesitancy or dysuria. MUSCULOSKELETAL: No joint or muscle pain, no back pain, no recent trauma. DERMATOLOGIC: No rash, no itching, no lesions. ENDOCRINE: No polyuria, polydipsia, no heat or cold intolerance. No recent change in weight. HEMATOLOGICAL: No anemia or easy bruising or bleeding. NEUROLOGIC: No headache, seizures, numbness, tingling or weakness. PSYCHIATRIC: No depression, no loss of interest in normal activity or change in sleep pattern.     Exam:   BP 110/70   Pulse 92   Ht 5' 6.75" (1.695  m)   Wt 111 lb (50.3 kg)   SpO2 99%   BMI 17.52 kg/m   Body mass index is 17.52 kg/m.  General appearance : Well developed well nourished female. No acute distress HEENT: Eyes: no retinal hemorrhage or exudates,  Neck supple, trachea midline, no carotid bruits, no thyroidmegaly Lungs: Clear to auscultation, no rhonchi or wheezes, or rib retractions  Heart: Regular rate and rhythm, no murmurs or gallops Breast:Examined in sitting and supine position were symmetrical in appearance, no palpable masses or tenderness,  no skin retraction, no nipple inversion, no nipple discharge, no skin discoloration, no axillary or supraclavicular lymphadenopathy Abdomen: no palpable masses or tenderness, no rebound or guarding Extremities: no edema or skin discoloration or tenderness  Pelvic: Deferred, re Virgin   Assessment/Plan:  27 y.o. female for annual exam   1. Well female exam with routine gynecological exam Virgin.  No Pap so far, would like to do one next year. Well on Lomedia Fe 1/20 24 continuous use for severe Dysmenorrhea.  No breakthrough bleeding and no pelvic pain.  Urine and bowel movements normal. Breasts normal. Left Breast US 03/2019 Benign. Body mass index at 17.52.  Mild increase in Calories recommended. Planning more weight lifting. Walking her dog.  2. Primary dysmenorrhea Well on Lomedia Fe 1/20 24 continuous use for severe Dysmenorrhea.  No breakthrough bleeding and no pelvic pain.  No CI to continue on BCPs.  Prescription sent to pharmacy.  Other orders - Norethindrone Acetate-Ethinyl Estrad-FE (HAILEY 24 FE) 1-20 MG-MCG(24) tablet; TAKE  1 TABLET BY MOUTH DAILY. CONTINUOUS USE FOR SEVERE DYSMENORRHEA   Genia Del MD, 2:11 PM

## 2023-01-27 ENCOUNTER — Encounter: Payer: BC Managed Care – PPO | Admitting: Obstetrics & Gynecology

## 2023-07-13 DIAGNOSIS — L308 Other specified dermatitis: Secondary | ICD-10-CM | POA: Diagnosis not present

## 2023-09-21 DIAGNOSIS — N390 Urinary tract infection, site not specified: Secondary | ICD-10-CM | POA: Diagnosis not present

## 2023-11-10 DIAGNOSIS — R8290 Unspecified abnormal findings in urine: Secondary | ICD-10-CM | POA: Diagnosis not present

## 2023-11-10 DIAGNOSIS — N3289 Other specified disorders of bladder: Secondary | ICD-10-CM | POA: Diagnosis not present

## 2023-11-15 DIAGNOSIS — R52 Pain, unspecified: Secondary | ICD-10-CM | POA: Diagnosis not present

## 2023-11-15 DIAGNOSIS — R21 Rash and other nonspecific skin eruption: Secondary | ICD-10-CM | POA: Diagnosis not present

## 2023-11-15 DIAGNOSIS — R5383 Other fatigue: Secondary | ICD-10-CM | POA: Diagnosis not present

## 2023-11-15 DIAGNOSIS — Z03818 Encounter for observation for suspected exposure to other biological agents ruled out: Secondary | ICD-10-CM | POA: Diagnosis not present

## 2023-11-15 DIAGNOSIS — R599 Enlarged lymph nodes, unspecified: Secondary | ICD-10-CM | POA: Diagnosis not present

## 2023-12-16 DIAGNOSIS — D72819 Decreased white blood cell count, unspecified: Secondary | ICD-10-CM | POA: Diagnosis not present

## 2023-12-16 DIAGNOSIS — R945 Abnormal results of liver function studies: Secondary | ICD-10-CM | POA: Diagnosis not present

## 2023-12-20 DIAGNOSIS — R8271 Bacteriuria: Secondary | ICD-10-CM | POA: Diagnosis not present

## 2024-01-04 DIAGNOSIS — Z111 Encounter for screening for respiratory tuberculosis: Secondary | ICD-10-CM | POA: Diagnosis not present

## 2024-02-01 ENCOUNTER — Encounter: Payer: Self-pay | Admitting: Radiology

## 2024-02-01 ENCOUNTER — Ambulatory Visit (INDEPENDENT_AMBULATORY_CARE_PROVIDER_SITE_OTHER): Admitting: Radiology

## 2024-02-01 ENCOUNTER — Other Ambulatory Visit (HOSPITAL_COMMUNITY)
Admission: RE | Admit: 2024-02-01 | Discharge: 2024-02-01 | Disposition: A | Discharge status: Home / Self Care | Source: Ambulatory Visit | Attending: Radiology | Admitting: Radiology

## 2024-02-01 VITALS — BP 102/64 | HR 114 | Ht 68.0 in | Wt 108.8 lb

## 2024-02-01 DIAGNOSIS — N944 Primary dysmenorrhea: Secondary | ICD-10-CM

## 2024-02-01 DIAGNOSIS — Z01419 Encounter for gynecological examination (general) (routine) without abnormal findings: Secondary | ICD-10-CM

## 2024-02-01 DIAGNOSIS — Z1331 Encounter for screening for depression: Secondary | ICD-10-CM | POA: Diagnosis not present

## 2024-02-01 MED ORDER — HAILEY 24 FE 1-20 MG-MCG(24) PO TABS: ORAL_TABLET | ORAL | 5 refills | Status: DC

## 2024-02-01 NOTE — Patient Instructions (Signed)

## 2024-02-01 NOTE — Progress Notes (Signed)
 Lynn Baker 1996/03/11 [id number]   History:  28 y.o. G0 presents for annual exam. No gyn concerns. Happy on OCPs for cycle control, takes continuously for severe dysmenorrhea. 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   Gynecologic History No LMP recorded. (Menstrual status: Oral contraceptives). Period Cycle (Days):  (amenorrhea with continuous COCs) Contraception/Family planning: abstinence and OCP (estrogen/progesterone) Sexually active: no Last Pap: never  Obstetric History OB History  Gravida Para Term Preterm AB Living  0 0 0 0 0 0  SAB IAB Ectopic Multiple Live Births  0 0 0 0 0       02/01/2024    9:54 AM  Depression screen PHQ 2/9  Decreased Interest 0  Down, Depressed, Hopeless 0  PHQ - 2 Score 0     The following portions of the patient's history were reviewed and updated as appropriate: allergies, current medications, past family history, past medical history, past social history, past surgical history, and problem list.  Review of Systems  All other systems reviewed and are negative.   Past medical history, past surgical history, family history and social history were all reviewed and documented in the EPIC chart.  Exam:  Vitals:   02/01/24 0952  BP: 102/64  Pulse: (!) 114  SpO2: 98%  Weight: 108 lb 12.8 oz (49.4 kg)  Height: 5\' 8"  (1.727 m)   Body mass index is 16.54 kg/m.  Physical Exam Vitals and nursing note reviewed. Exam conducted with a chaperone present.  Constitutional:      Appearance: Normal appearance. She is normal weight.  HENT:     Head: Normocephalic and atraumatic.  Neck:     Thyroid: No thyroid mass, thyromegaly or thyroid tenderness.  Cardiovascular:     Rate and Rhythm: Regular rhythm.     Heart sounds: Normal heart sounds.  Pulmonary:     Effort: Pulmonary effort is normal.     Breath sounds: Normal breath sounds.  Chest:  Breasts:    Breasts are symmetrical.     Right: Normal. No inverted nipple, mass, nipple  discharge, skin change or tenderness.     Left: Normal. No inverted nipple, mass, nipple discharge, skin change or tenderness.  Abdominal:     General: Abdomen is flat. Bowel sounds are normal.     Palpations: Abdomen is soft.  Genitourinary:    General: Normal vulva.     Vagina: Normal. No vaginal discharge, bleeding or lesions.     Cervix: Normal. No discharge or lesion.     Uterus: Normal. Not enlarged and not tender.      Adnexa: Right adnexa normal and left adnexa normal.       Right: No mass, tenderness or fullness.         Left: No mass, tenderness or fullness.       Comments: Peds spec used Lymphadenopathy:     Upper Body:     Right upper body: No axillary adenopathy.     Left upper body: No axillary adenopathy.  Skin:    General: Skin is warm and dry.  Neurological:     Mental Status: She is alert and oriented to person, place, and time.  Psychiatric:        Mood and Affect: Mood normal.        Thought Content: Thought content normal.        Judgment: Judgment normal.      Ellis Guys, CMA present for exam  Assessment/Plan:   1. Well woman exam with routine  gynecological exam (Primary) - Cytology - PAP( [place]) - Increase calories to increase weight  2. Primary dysmenorrhea - Norethindrone Acetate-Ethinyl Estrad-FE (HAILEY  24 FE) 1-20 MG-MCG(24) tablet; TAKE 1 TABLET BY MOUTH DAILY. CONTINUOUS USE FOR SEVERE DYSMENORRHEA  Dispense: 84 tablet; Refill: 5  3. Depression screening negative     Return in about 1 year (around 01/31/2025) for Annual.  Synetta Eves B WHNP-BC 10:09 AM 02/01/2024

## 2024-02-02 ENCOUNTER — Ambulatory Visit: Payer: Self-pay | Admitting: Radiology

## 2024-02-02 LAB — CYTOLOGY - PAP: Diagnosis: NEGATIVE

## 2024-02-03 DIAGNOSIS — Z0000 Encounter for general adult medical examination without abnormal findings: Secondary | ICD-10-CM | POA: Diagnosis not present

## 2024-02-03 DIAGNOSIS — R636 Underweight: Secondary | ICD-10-CM | POA: Diagnosis not present

## 2024-03-21 DIAGNOSIS — Z1283 Encounter for screening for malignant neoplasm of skin: Secondary | ICD-10-CM | POA: Diagnosis not present

## 2024-03-21 DIAGNOSIS — D225 Melanocytic nevi of trunk: Secondary | ICD-10-CM | POA: Diagnosis not present

## 2024-05-09 ENCOUNTER — Other Ambulatory Visit: Payer: Self-pay

## 2024-05-09 DIAGNOSIS — N944 Primary dysmenorrhea: Secondary | ICD-10-CM

## 2024-05-09 MED ORDER — HAILEY 24 FE 1-20 MG-MCG(24) PO TABS: ORAL_TABLET | ORAL | 5 refills | Status: DC

## 2024-05-09 MED ORDER — HAILEY 24 FE 1-20 MG-MCG(24) PO TABS: ORAL_TABLET | ORAL | 0 refills | Status: AC

## 2024-05-09 NOTE — Telephone Encounter (Signed)
 Yes, that's fine.
